# Patient Record
Sex: Male | Born: 1990 | Race: Black or African American | Hispanic: No | Marital: Single | Smoking: Current every day smoker
Health system: Southern US, Community
[De-identification: ages and names within clinical notes are randomized; demographics above are authoritative.]

## PROBLEM LIST (undated history)

## (undated) DIAGNOSIS — N3090 Cystitis, unspecified without hematuria: Secondary | ICD-10-CM

## (undated) DIAGNOSIS — G8220 Paraplegia, unspecified: Secondary | ICD-10-CM

---
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
## 2010-07-09 ENCOUNTER — Emergency Department (HOSPITAL_COMMUNITY): Admission: EM | Admit: 2010-07-09 | Discharge: 2010-07-09 | Payer: Self-pay | Admitting: Emergency Medicine

## 2010-12-27 ENCOUNTER — Emergency Department (HOSPITAL_COMMUNITY): Payer: Medicaid Other

## 2010-12-27 ENCOUNTER — Emergency Department (HOSPITAL_COMMUNITY)
Admission: EM | Admit: 2010-12-27 | Discharge: 2010-12-28 | Disposition: A | Discharge status: Other Acute Inpatient Hospital | Payer: Medicaid Other | Source: Home / Self Care | Attending: Emergency Medicine | Admitting: Emergency Medicine

## 2010-12-27 DIAGNOSIS — A419 Sepsis, unspecified organism: Secondary | ICD-10-CM | POA: Insufficient documentation

## 2010-12-27 DIAGNOSIS — N5089 Other specified disorders of the male genital organs: Secondary | ICD-10-CM | POA: Insufficient documentation

## 2010-12-27 DIAGNOSIS — N453 Epididymo-orchitis: Secondary | ICD-10-CM | POA: Insufficient documentation

## 2010-12-27 DIAGNOSIS — R000 Tachycardia, unspecified: Secondary | ICD-10-CM | POA: Insufficient documentation

## 2010-12-27 DIAGNOSIS — N509 Disorder of male genital organs, unspecified: Secondary | ICD-10-CM | POA: Insufficient documentation

## 2010-12-27 DIAGNOSIS — G8220 Paraplegia, unspecified: Secondary | ICD-10-CM | POA: Insufficient documentation

## 2010-12-27 DIAGNOSIS — N390 Urinary tract infection, site not specified: Secondary | ICD-10-CM | POA: Insufficient documentation

## 2010-12-27 LAB — CBC
HCT: 38.8 % — ABNORMAL LOW (ref 39.0–52.0)
MCH: 26.7 pg (ref 26.0–34.0)
MCV: 73.1 fL — ABNORMAL LOW (ref 78.0–100.0)
RDW: 13.1 % (ref 11.5–15.5)
WBC: 25.4 10*3/uL — ABNORMAL HIGH (ref 4.0–10.5)

## 2010-12-27 LAB — URINE MICROSCOPIC-ADD ON

## 2010-12-27 LAB — BASIC METABOLIC PANEL
CO2: 26 mEq/L (ref 19–32)
Calcium: 9 mg/dL (ref 8.4–10.5)
Glucose, Bld: 96 mg/dL (ref 70–99)
Potassium: 2.9 mEq/L — ABNORMAL LOW (ref 3.5–5.1)
Sodium: 131 mEq/L — ABNORMAL LOW (ref 135–145)

## 2010-12-27 LAB — URINALYSIS, ROUTINE W REFLEX MICROSCOPIC
Bilirubin Urine: NEGATIVE
Specific Gravity, Urine: 1.02 (ref 1.005–1.030)
pH: 6 (ref 5.0–8.0)

## 2010-12-27 LAB — DIFFERENTIAL
Basophils Relative: 0 % (ref 0–1)
Eosinophils Relative: 0 % (ref 0–5)
Monocytes Absolute: 2 10*3/uL — ABNORMAL HIGH (ref 0.1–1.0)
Neutro Abs: 21.1 10*3/uL — ABNORMAL HIGH (ref 1.7–7.7)
Neutrophils Relative %: 83 % — ABNORMAL HIGH (ref 43–77)

## 2010-12-28 ENCOUNTER — Observation Stay (HOSPITAL_COMMUNITY)
Admission: AD | Admit: 2010-12-28 | Discharge: 2010-12-28 | DRG: 728 | Disposition: A | Discharge status: Home / Self Care | Payer: Medicaid Other | Source: Ambulatory Visit | Attending: Urology | Admitting: Urology

## 2010-12-28 DIAGNOSIS — R509 Fever, unspecified: Secondary | ICD-10-CM | POA: Insufficient documentation

## 2010-12-28 DIAGNOSIS — Z993 Dependence on wheelchair: Secondary | ICD-10-CM | POA: Insufficient documentation

## 2010-12-28 DIAGNOSIS — N390 Urinary tract infection, site not specified: Secondary | ICD-10-CM | POA: Insufficient documentation

## 2010-12-28 DIAGNOSIS — G8220 Paraplegia, unspecified: Secondary | ICD-10-CM | POA: Insufficient documentation

## 2010-12-28 DIAGNOSIS — F17200 Nicotine dependence, unspecified, uncomplicated: Secondary | ICD-10-CM | POA: Insufficient documentation

## 2010-12-28 DIAGNOSIS — I498 Other specified cardiac arrhythmias: Secondary | ICD-10-CM | POA: Insufficient documentation

## 2010-12-28 DIAGNOSIS — IMO0002 Reserved for concepts with insufficient information to code with codable children: Secondary | ICD-10-CM | POA: Insufficient documentation

## 2010-12-28 DIAGNOSIS — N453 Epididymo-orchitis: Principal | ICD-10-CM | POA: Insufficient documentation

## 2010-12-28 DIAGNOSIS — E876 Hypokalemia: Secondary | ICD-10-CM | POA: Insufficient documentation

## 2010-12-28 DIAGNOSIS — N319 Neuromuscular dysfunction of bladder, unspecified: Secondary | ICD-10-CM | POA: Insufficient documentation

## 2010-12-28 LAB — CBC
Platelets: 263 10*3/uL (ref 150–400)
RDW: 13.2 % (ref 11.5–15.5)
WBC: 21.8 10*3/uL — ABNORMAL HIGH (ref 4.0–10.5)

## 2010-12-28 LAB — DIFFERENTIAL
Basophils Absolute: 0 10*3/uL (ref 0.0–0.1)
Eosinophils Absolute: 0.2 10*3/uL (ref 0.0–0.7)
Lymphocytes Relative: 7 % — ABNORMAL LOW (ref 12–46)
Neutrophils Relative %: 81 % — ABNORMAL HIGH (ref 43–77)

## 2010-12-28 LAB — BASIC METABOLIC PANEL
BUN: 7 mg/dL (ref 6–23)
GFR calc non Af Amer: >60 mL/min (ref >60)
Potassium: 3.1 mEq/L — ABNORMAL LOW (ref 3.5–5.1)
Sodium: 136 mEq/L (ref 135–145)

## 2010-12-29 LAB — URINE CULTURE: Colony Count: >=100000
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
## 2011-01-02 LAB — CULTURE, BLOOD (ROUTINE X 2)
Culture  Setup Time: 201203072318
Culture: NO GROWTH

## 2011-01-04 LAB — CBC
Hemoglobin: 15.7 g/dL (ref 13.0–17.0)
MCHC: 35.1 g/dL (ref 30.0–36.0)
Platelets: 247 10*3/uL (ref 150–400)
RBC: 5.78 MIL/uL (ref 4.22–5.81)

## 2011-01-04 LAB — HEMOCCULT GUIAC POC 1CARD (OFFICE): Fecal Occult Bld: POSITIVE

## 2011-01-10 LAB — URINALYSIS, ROUTINE W REFLEX MICROSCOPIC
Glucose, UA: NEGATIVE mg/dL
Nitrite: POSITIVE — AB
Protein, ur: NEGATIVE mg/dL

## 2011-01-10 LAB — URINE MICROSCOPIC-ADD ON

## 2011-01-10 LAB — URINE CULTURE: Colony Count: >=100000
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
## 2011-03-06 NOTE — Assessment & Plan Note (Signed)
Roy Rodriguez is back regarding his T10 spinal cord injury.  Pain is under fair  control.  Skin is improved.  He has been placed on prophylactic  antibiotic for urinary tract infections.  He continues to use his Ace  wraps for edema control.  He reports occasional spasm, but nothing  substantial.  It usually will take place when he takes off his wraps for  transfers.  Bowels are on regular schedule and his bladder has been  stable for the most part recently.  He is looking at getting him to  school or vocational areas, but would like adaptations for his car.  He  has been in contact with vocational rehab preliminarily so far.   REVIEW OF SYSTEMS:  Notable for the above.  Full 14-point review is in  the written health and history section of the chart.  He reports really  no dysreflexic symptoms.   SOCIAL HISTORY:  The patient is single, living with his mother without  change.   PHYSICAL EXAMINATION:  VITAL SIGNS:  Blood pressure 134/50, pulse is 72,  respiratory rate 18, and he is sating 98% on room air.  NEUROLOGIC:  The patient is pleasant, alert, and oriented x3.  Affect  showing bright and appropriate.  He is in a wheelchair.  He is fully  mobile and able to transfer on his own.  He continues to have a T10  sensory level.  He has no noticeable tone.  Skin is under control.  Strength, below the level injury 0/5.  HEART:  Regular.  CHEST:  Clear.  ABDOMEN:  Soft and nontender.   ASSESSMENT:  1. American Spinal Injury Association (ASIA) A T10 spinal cord injury.  2. Neurogenic bowel and bladder.   PLAN:  1. Continue with current maintenance as he is doing.  He is doing a      fine job.  2. Urology followup in [place].  3. [place], contact with vocational rehab.  They should be able      to assist him with pain for car adaptations that he needs to go to      school or to work.  Roy Rodriguez seems fairly motivated to move on with      his life and hopefully this will happen over the  next few months'      time.  4. I will see him back on a yearly basis unless needed sooner.       Ranelle Oyster, M.D.  Electronically Signed     ZTS/MedQ  D:  03/02/2009 10:45:43  T:  03/03/2009 00:50:45  Job #:  [id number]   cc:   Stefani Dama, M.D.  Fax: [phone]

## 2011-06-23 DIAGNOSIS — N3090 Cystitis, unspecified without hematuria: Secondary | ICD-10-CM

## 2011-06-23 HISTORY — DX: Cystitis, unspecified without hematuria: N30.90

## 2011-07-23 LAB — BASIC METABOLIC PANEL
BUN: 10
Calcium: 8.5
Chloride: 102
Glucose, Bld: 139 — ABNORMAL HIGH
Potassium: 3.7
Potassium: 4
Sodium: 134 — ABNORMAL LOW
Sodium: 137

## 2011-07-23 LAB — CBC
HCT: 30.4 — ABNORMAL LOW
HCT: 31.5 — ABNORMAL LOW
HCT: 33.1 — ABNORMAL LOW
Hemoglobin: 10.3 — ABNORMAL LOW
Hemoglobin: 10.6 — ABNORMAL LOW
MCHC: 33.6
MCV: 78
MCV: 79.8
Platelets: 141 — ABNORMAL LOW
Platelets: 168
Platelets: 210
RBC: 3.89
RDW: 13.2
RDW: 13.3
RDW: 13.6
WBC: 12.1
WBC: 5.9
WBC: 6.3

## 2011-07-23 LAB — URINALYSIS, ROUTINE W REFLEX MICROSCOPIC
Bilirubin Urine: NEGATIVE
Glucose, UA: 100 — AB
Hgb urine dipstick: NEGATIVE
Ketones, ur: 15 — AB
Ketones, ur: NEGATIVE
Nitrite: NEGATIVE
Protein, ur: 100 — AB
Protein, ur: NEGATIVE
Specific Gravity, Urine: 1.022
Specific Gravity, Urine: 1.026
Urobilinogen, UA: 1
Urobilinogen, UA: 1

## 2011-07-23 LAB — POCT I-STAT, CHEM 8
Creatinine, Ser: 1.5
Glucose, Bld: 106 — ABNORMAL HIGH
HCT: 44
Hemoglobin: 15
Potassium: 2.9 — ABNORMAL LOW
Sodium: 138
TCO2: 24

## 2011-07-23 LAB — URINE CULTURE
Colony Count: NO GROWTH
Colony Count: NO GROWTH
Culture: NO GROWTH

## 2011-07-23 LAB — RAPID URINE DRUG SCREEN, HOSP PERFORMED
Amphetamines: NOT DETECTED
Benzodiazepines: NOT DETECTED
Cocaine: NOT DETECTED

## 2011-07-23 LAB — URINE MICROSCOPIC-ADD ON

## 2011-07-23 LAB — CULTURE, BLOOD (ROUTINE X 2): Culture: NO GROWTH

## 2011-07-23 LAB — PROTIME-INR
INR: 1.2
Prothrombin Time: 15.4 — ABNORMAL HIGH

## 2011-09-03 ENCOUNTER — Encounter: Payer: Self-pay | Admitting: *Deleted

## 2011-09-03 ENCOUNTER — Emergency Department (HOSPITAL_COMMUNITY)
Admission: EM | Admit: 2011-09-03 | Discharge: 2011-09-04 | Discharge status: Against Medical Advice | Payer: Medicaid Other | Attending: Emergency Medicine | Admitting: Emergency Medicine

## 2011-09-03 DIAGNOSIS — J069 Acute upper respiratory infection, unspecified: Secondary | ICD-10-CM | POA: Insufficient documentation

## 2011-09-03 HISTORY — DX: Paraplegia, unspecified: G82.20

## 2011-09-03 NOTE — ED Notes (Signed)
Pt in c/o swollen glands and cough and sore throat x2-3 days

## 2011-11-30 ENCOUNTER — Encounter (HOSPITAL_COMMUNITY): Payer: Self-pay | Admitting: Emergency Medicine

## 2011-11-30 ENCOUNTER — Inpatient Hospital Stay (HOSPITAL_COMMUNITY)
Admission: EM | Admit: 2011-11-30 | Discharge: 2011-12-03 | DRG: 689 | Discharge status: Against Medical Advice | Payer: Medicaid Other | Attending: Internal Medicine | Admitting: Internal Medicine

## 2011-11-30 DIAGNOSIS — R109 Unspecified abdominal pain: Secondary | ICD-10-CM | POA: Diagnosis present

## 2011-11-30 DIAGNOSIS — J189 Pneumonia, unspecified organism: Secondary | ICD-10-CM | POA: Diagnosis present

## 2011-11-30 DIAGNOSIS — Z79899 Other long term (current) drug therapy: Secondary | ICD-10-CM

## 2011-11-30 DIAGNOSIS — Z880 Allergy status to penicillin: Secondary | ICD-10-CM

## 2011-11-30 DIAGNOSIS — G8220 Paraplegia, unspecified: Secondary | ICD-10-CM | POA: Diagnosis present

## 2011-11-30 DIAGNOSIS — D72829 Elevated white blood cell count, unspecified: Secondary | ICD-10-CM | POA: Diagnosis present

## 2011-11-30 DIAGNOSIS — N319 Neuromuscular dysfunction of bladder, unspecified: Secondary | ICD-10-CM | POA: Diagnosis present

## 2011-11-30 DIAGNOSIS — J90 Pleural effusion, not elsewhere classified: Secondary | ICD-10-CM | POA: Clinically undetermined

## 2011-11-30 DIAGNOSIS — N12 Tubulo-interstitial nephritis, not specified as acute or chronic: Principal | ICD-10-CM | POA: Diagnosis present

## 2011-11-30 DIAGNOSIS — G894 Chronic pain syndrome: Secondary | ICD-10-CM | POA: Diagnosis present

## 2011-11-30 HISTORY — DX: Cystitis, unspecified without hematuria: N30.90

## 2011-11-30 LAB — URINALYSIS, ROUTINE W REFLEX MICROSCOPIC
Glucose, UA: NEGATIVE mg/dL
Hgb urine dipstick: NEGATIVE
Protein, ur: NEGATIVE mg/dL
Specific Gravity, Urine: 1.019 (ref 1.005–1.030)
Urobilinogen, UA: 1 mg/dL (ref 0.0–1.0)

## 2011-11-30 LAB — CBC
HCT: 42.3 % (ref 39.0–52.0)
MCH: 26.8 pg (ref 26.0–34.0)
MCV: 75.7 fL — ABNORMAL LOW (ref 78.0–100.0)
RBC: 5.59 MIL/uL (ref 4.22–5.81)
WBC: 24.4 10*3/uL — ABNORMAL HIGH (ref 4.0–10.5)

## 2011-11-30 LAB — COMPREHENSIVE METABOLIC PANEL
AST: 16 U/L (ref 0–37)
Albumin: 4.3 g/dL (ref 3.5–5.2)
BUN: 8 mg/dL (ref 6–23)
Creatinine, Ser: 0.73 mg/dL (ref 0.50–1.35)
Potassium: 3.6 mEq/L (ref 3.5–5.1)
Total Protein: 8.1 g/dL (ref 6.0–8.3)

## 2011-11-30 LAB — URINE MICROSCOPIC-ADD ON

## 2011-11-30 LAB — DIFFERENTIAL
Eosinophils Relative: 0 % (ref 0–5)
Lymphocytes Relative: 7 % — ABNORMAL LOW (ref 12–46)
Lymphs Abs: 1.6 10*3/uL (ref 0.7–4.0)
Monocytes Absolute: 1.4 10*3/uL — ABNORMAL HIGH (ref 0.1–1.0)
Monocytes Relative: 6 % (ref 3–12)

## 2011-11-30 MED ORDER — DEXTROSE 5 % IV SOLN
1.0000 g | Freq: Once | INTRAVENOUS | Status: AC
  Administered 2011-11-30: 1 g via INTRAVENOUS
  Filled 2011-11-30: qty 10

## 2011-11-30 MED ORDER — SODIUM CHLORIDE 0.9 % IV SOLN
INTRAVENOUS | Status: DC
  Administered 2011-11-30: 22:00:00 via INTRAVENOUS

## 2011-11-30 MED ORDER — ONDANSETRON HCL 4 MG/2ML IJ SOLN: 4.0000 mg | Freq: Three times a day (TID) | INTRAMUSCULAR | Status: AC | PRN

## 2011-11-30 MED ORDER — FENTANYL CITRATE 0.05 MG/ML IJ SOLN
100.0000 ug | Freq: Once | INTRAMUSCULAR | Status: AC
  Administered 2011-11-30: 100 ug via INTRAVENOUS
  Filled 2011-11-30: qty 2

## 2011-11-30 MED ORDER — SODIUM CHLORIDE 0.9 % IV SOLN: INTRAVENOUS | Status: DC

## 2011-11-30 MED ORDER — HYDROMORPHONE HCL PF 1 MG/ML IJ SOLN
1.0000 mg | INTRAMUSCULAR | Status: AC | PRN
  Filled 2011-11-30: qty 1

## 2011-11-30 NOTE — ED Notes (Signed)
Pt c/o left rib pain x 2 days; pt denies obvious injury; pt with hx of paraplegia; pt sts painful to take a deep breath
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
## 2011-12-01 ENCOUNTER — Encounter (HOSPITAL_COMMUNITY): Payer: Self-pay | Admitting: *Deleted

## 2011-12-01 DIAGNOSIS — R109 Unspecified abdominal pain: Secondary | ICD-10-CM | POA: Diagnosis present

## 2011-12-01 DIAGNOSIS — N12 Tubulo-interstitial nephritis, not specified as acute or chronic: Secondary | ICD-10-CM | POA: Diagnosis present

## 2011-12-01 DIAGNOSIS — G8220 Paraplegia, unspecified: Secondary | ICD-10-CM | POA: Diagnosis present

## 2011-12-01 DIAGNOSIS — N319 Neuromuscular dysfunction of bladder, unspecified: Secondary | ICD-10-CM | POA: Diagnosis present

## 2011-12-01 DIAGNOSIS — G894 Chronic pain syndrome: Secondary | ICD-10-CM | POA: Diagnosis present

## 2011-12-01 LAB — BASIC METABOLIC PANEL
BUN: 8 mg/dL (ref 6–23)
Chloride: 99 mEq/L (ref 96–112)
GFR calc non Af Amer: >90 mL/min (ref >90)
Glucose, Bld: 85 mg/dL (ref 70–99)
Potassium: 4 mEq/L (ref 3.5–5.1)

## 2011-12-01 LAB — CBC
HCT: 38.7 % — ABNORMAL LOW (ref 39.0–52.0)
Hemoglobin: 13.2 g/dL (ref 13.0–17.0)
MCH: 26 pg (ref 26.0–34.0)
MCHC: 34.1 g/dL (ref 30.0–36.0)
MCV: 76.2 fL — ABNORMAL LOW (ref 78.0–100.0)

## 2011-12-01 MED ORDER — ONDANSETRON HCL 4 MG PO TABS: 4.0000 mg | ORAL_TABLET | Freq: Four times a day (QID) | ORAL | Status: DC | PRN

## 2011-12-01 MED ORDER — OXYCODONE HCL 5 MG PO TABS
5.0000 mg | ORAL_TABLET | ORAL | Status: DC | PRN
  Administered 2011-12-01: 5 mg via ORAL
  Filled 2011-12-01: qty 1

## 2011-12-01 MED ORDER — ALUM & MAG HYDROXIDE-SIMETH 200-200-20 MG/5ML PO SUSP: 30.0000 mL | Freq: Four times a day (QID) | ORAL | Status: DC | PRN

## 2011-12-01 MED ORDER — ENOXAPARIN SODIUM 40 MG/0.4ML ~~LOC~~ SOLN
40.0000 mg | Freq: Every day | SUBCUTANEOUS | Status: DC
  Administered 2011-12-01 – 2011-12-02 (×2): 40 mg via SUBCUTANEOUS
  Filled 2011-12-01 (×3): qty 0.4

## 2011-12-01 MED ORDER — ACETAMINOPHEN 325 MG PO TABS: 650.0000 mg | ORAL_TABLET | ORAL | Status: DC | PRN

## 2011-12-01 MED ORDER — HYDROMORPHONE HCL PF 1 MG/ML IJ SOLN
0.5000 mg | INTRAMUSCULAR | Status: DC | PRN
  Administered 2011-12-01: 0.5 mg via INTRAVENOUS
  Administered 2011-12-01 (×3): 1 mg via INTRAVENOUS
  Administered 2011-12-02 – 2011-12-03 (×2): 0.5 mg via INTRAVENOUS
  Filled 2011-12-01 (×5): qty 1

## 2011-12-01 MED ORDER — ONDANSETRON HCL 4 MG/2ML IJ SOLN: 4.0000 mg | Freq: Four times a day (QID) | INTRAMUSCULAR | Status: DC | PRN

## 2011-12-01 MED ORDER — SODIUM CHLORIDE 0.9 % IV SOLN
INTRAVENOUS | Status: DC
  Administered 2011-12-01 – 2011-12-02 (×4): via INTRAVENOUS

## 2011-12-01 MED ORDER — ACETAMINOPHEN 325 MG PO TABS: 650.0000 mg | ORAL_TABLET | Freq: Four times a day (QID) | ORAL | Status: DC | PRN

## 2011-12-01 MED ORDER — ACETAMINOPHEN 650 MG RE SUPP: 650.0000 mg | Freq: Four times a day (QID) | RECTAL | Status: DC | PRN

## 2011-12-01 MED ORDER — DEXTROSE 5 % IV SOLN
1.0000 g | Freq: Every day | INTRAVENOUS | Status: DC
  Administered 2011-12-01 – 2011-12-02 (×2): 1 g via INTRAVENOUS
  Filled 2011-12-01 (×3): qty 10

## 2011-12-01 MED ORDER — ZOLPIDEM TARTRATE 5 MG PO TABS: 5.0000 mg | ORAL_TABLET | Freq: Every evening | ORAL | Status: DC | PRN

## 2011-12-01 NOTE — Plan of Care (Signed)
Problem: Phase I Progression Outcomes Goal: Voiding-avoid urinary catheter unless indicated Outcome: Completed/Met Date Met:  12/01/11 Self caths

## 2011-12-01 NOTE — Progress Notes (Signed)
Patient ID: Roy Rodriguez, male   DOB: 29-Jun-1991, 21 y.o.   MRN: [id number] Patient is a 21 year old paraplegic who presented with complaints of abdominal pain and subjective fever and chills. The patient was found to have a urinalysis consistent with urinary tract infection. Please note that the patient does not have a chronic indwelling Foley and does in and out catheterization. Thus I suspect that the urine is representative urinary tract infection. The patient has no complaints at this time states that he's feeling better.  Objective: General: The patient is admitted that he is well-appearing. He is in no acute distress. Lungs: Lungs are clear to auscultation no wheezing rhonchi noted. Abdomen: Abdomen is soft nontender nondistended no masses or hepatosplenomegaly noted. There is no CVA tenderness noted Cardiovascular: Patient has a normal S1 and S2 no murmurs rubs gallops noted.   Assessment/plan:  The patient was started on Rocephin. I will continue him on Rocephin until the urine cultures back and a narrow the antibiotics accordingly.
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
## 2011-12-01 NOTE — ED Notes (Signed)
Report given to [facility] on floor and patient ready for admit.

## 2011-12-02 ENCOUNTER — Inpatient Hospital Stay (HOSPITAL_COMMUNITY): Payer: Medicaid Other

## 2011-12-02 MED ORDER — BISACODYL 10 MG RE SUPP: 10.0000 mg | Freq: Once | RECTAL | Status: DC

## 2011-12-02 NOTE — Progress Notes (Signed)
Subjective: Patient has complaints of pain in the anterior rib cage on the left. He states it's been there for 3 days and although this improved it has not resolved. Patient denies any trauma or strenuous activity. Objective: Filed Vitals:   12/01/11 1812 12/01/11 2109 12/02/11 0555 12/02/11 1404  BP: 121/72 126/66 116/65 150/72  Pulse: 82 107 86 99  Temp: 98.4 F (36.9 C) 98.2 F (36.8 C) 98.7 F (37.1 C) 99.1 F (37.3 C)  TempSrc: Oral Oral Oral Oral  Resp: 18 18 18 17   Height:      Weight:      SpO2: 99% 99% 97% 100%   Weight change:   Intake/Output Summary (Last 24 hours) at 12/02/11 1718 Last data filed at 12/01/11 1807  Gross per 24 hour  Intake      0 ml  Output    350 ml  Net   -350 ml    General: Alert, awake, oriented x3, in no acute distress.  HEENT: [place]/AT PEERL, EOMI Neck: Trachea midline,  no masses, no thyromegal,y no JVD, no carotid bruit OROPHARYNX:  Moist, No exudate/ erythema/lesions.  Heart: Regular rate and rhythm, without murmurs, rubs, gallops, PMI non-displaced, no heaves or thrills on palpation.  Lungs: Clear to auscultation, no wheezing or rhonchi noted. No increased vocal fremitus resonant to percussion  Abdomen: Soft, nontender, nondistended, positive bowel sounds, no masses no hepatosplenomegaly noted..  Neuro: No focal neurological deficits noted cranial nerves II through XII grossly intact. DTRs 2+ bilaterally upper and lower extremities. Strength 5/5 in bilateral upper extremities. Musculoskeletal: No warm swelling or erythema around joints, no spinal tenderness noted.   Lab Results:  [facility] 12/01/11 0630 11/30/11 2134  NA 137 133*  K 4.0 3.6  CL 99 94*  CO2 29 27  GLUCOSE 85 93  BUN 8 8  CREATININE 0.69 0.73  CALCIUM 10.0 10.5  MG -- --  PHOS -- --    Basename 11/30/11 2134  AST 16  ALT 13  ALKPHOS 58  BILITOT 0.6  PROT 8.1  ALBUMIN 4.3   No results found for this basename: LIPASE:2,AMYLASE:2 in the last 72  hours  Basename 12/01/11 0630 11/30/11 2134  WBC 17.6* 24.4*  NEUTROABS -- 21.4*  HGB 13.2 15.0  HCT 38.7* 42.3  MCV 76.2* 75.7*  PLT 263 264   No results found for this basename: CKTOTAL:3,CKMB:3,CKMBINDEX:3,TROPONINI:3 in the last 72 hours No components found with this basename: POCBNP:3 No results found for this basename: DDIMER:2 in the last 72 hours No results found for this basename: HGBA1C:2 in the last 72 hours No results found for this basename: CHOL:2,HDL:2,LDLCALC:2,TRIG:2,CHOLHDL:2,LDLDIRECT:2 in the last 72 hours No results found for this basename: TSH,T4TOTAL,FREET3,T3FREE,THYROIDAB in the last 72 hours No results found for this basename: VITAMINB12:2,FOLATE:2,FERRITIN:2,TIBC:2,IRON:2,RETICCTPCT:2 in the last 72 hours  Micro Results: Recent Results (from the past 240 hour(s))  CULTURE, BLOOD (ROUTINE X 2)     Status: Normal (Preliminary result)   Collection Time   11/30/11  9:22 PM      Component Value Range Status Comment   Specimen Description BLOOD LEFT ARM   Final    Special Requests BOTTLES DRAWN AEROBIC AND ANAEROBIC [facility]   Final    Culture  Setup Time [id number]   Final    Culture     Final    Value:        BLOOD CULTURE RECEIVED NO GROWTH TO DATE CULTURE WILL BE HELD FOR 5 DAYS BEFORE ISSUING A FINAL NEGATIVE REPORT  Report Status PENDING   Incomplete   CULTURE, BLOOD (ROUTINE X 2)     Status: Normal (Preliminary result)   Collection Time   11/30/11  9:34 PM      Component Value Range Status Comment   Specimen Description BLOOD RIGHT ARM   Final    Special Requests     Final    Value: BOTTLES DRAWN AEROBIC AND ANAEROBIC 6CC AEROBIC 5CC ANAEROBIC   Culture  Setup Time [id number]   Final    Culture     Final    Value:        BLOOD CULTURE RECEIVED NO GROWTH TO DATE CULTURE WILL BE HELD FOR 5 DAYS BEFORE ISSUING A FINAL NEGATIVE REPORT   Report Status PENDING   Incomplete     Studies/Results: No results found.  Medications: I have reviewed the  patient's current medications. Scheduled Meds:   . bisacodyl  10 mg Rectal Once  . cefTRIAXone (ROCEPHIN)  IV  1 g Intravenous QHS  . enoxaparin  40 mg Subcutaneous Daily   Continuous Infusions:   . DISCONTD: sodium chloride 100 mL/hr at 12/02/11 0955   PRN Meds:.acetaminophen, acetaminophen, alum & mag hydroxide-simeth, HYDROmorphone, ondansetron (ZOFRAN) IV, ondansetron, oxyCODONE, zolpidem Assessment/Plan: Patient Active Hospital Problem List: Pyelonephritis (12/01/2011)   Assessment: Continue Rocephin pending   Abdominal pain (12/01/2011)   Assessment: resolved    Neurogenic bladder (12/01/2011)   Assessment: Intermittent catheterization      chest wall pain (12/01/2011)   Assessment: We'll get an x-ray of the chest to rule out fracture the ribs.     LOS: 2 days

## 2011-12-03 ENCOUNTER — Other Ambulatory Visit: Payer: Self-pay

## 2011-12-03 DIAGNOSIS — J90 Pleural effusion, not elsewhere classified: Secondary | ICD-10-CM | POA: Clinically undetermined

## 2011-12-03 LAB — CBC
MCH: 26.5 pg (ref 26.0–34.0)
MCHC: 34.6 g/dL (ref 30.0–36.0)
MCV: 76.7 fL — ABNORMAL LOW (ref 78.0–100.0)
Platelets: 302 10*3/uL (ref 150–400)
RDW: 13.9 % (ref 11.5–15.5)

## 2011-12-03 LAB — DIFFERENTIAL
Basophils Absolute: 0 10*3/uL (ref 0.0–0.1)
Eosinophils Absolute: 0.2 10*3/uL (ref 0.0–0.7)
Eosinophils Relative: 2 % (ref 0–5)

## 2011-12-03 MED ORDER — DEXTROSE 5 % IV SOLN
500.0000 mg | INTRAVENOUS | Status: DC
  Administered 2011-12-03: 500 mg via INTRAVENOUS
  Filled 2011-12-03 (×2): qty 500

## 2011-12-03 MED ORDER — LEVOFLOXACIN 750 MG PO TABS: 750.0000 mg | ORAL_TABLET | Freq: Every day | ORAL | Status: DC

## 2011-12-03 MED ORDER — LEVOFLOXACIN 750 MG PO TABS: 750.0000 mg | ORAL_TABLET | Freq: Every day | ORAL | Status: AC

## 2011-12-03 NOTE — Progress Notes (Signed)
Subjective: Patient has complaints of pain in the anterior rib cage on the left. He states it's been there for 3 days and although this improved it has not resolved. Patient denies any trauma or strenuous activity. Objective: Filed Vitals:   12/02/11 0555 12/02/11 1404 12/02/11 2120 12/03/11 0532  BP: 116/65 150/72 135/75 119/63  Pulse: 86 99 97 116  Temp: 98.7 F (37.1 C) 99.1 F (37.3 C) 99.3 F (37.4 C) 98.6 F (37 C)  TempSrc: Oral Oral Oral Oral  Resp: 18 17 17 17   Height:      Weight:      SpO2: 97% 100% 96% 100%   Weight change:  No intake or output data in the 24 hours ending 12/03/11 0714  General: Alert, awake, oriented x3, in no acute distress.  HEENT: [place]/AT PEERL, EOMI Neck: Trachea midline,  no masses, no thyromegal,y no JVD, no carotid bruit OROPHARYNX:  Moist, No exudate/ erythema/lesions.  Heart: Regular rate and rhythm, without murmurs, rubs, gallops, PMI non-displaced, no heaves or thrills on palpation.  Lungs: Clear to auscultation, no wheezing or rhonchi noted. No increased vocal fremitus resonant to percussion  Abdomen: Soft, nontender, nondistended, positive bowel sounds, no masses no hepatosplenomegaly noted..  Neuro: No focal neurological deficits noted cranial nerves II through XII grossly intact. DTRs 2+ bilaterally upper and lower extremities. Strength 5/5 in bilateral upper extremities. Musculoskeletal: No warm swelling or erythema around joints, no spinal tenderness noted.   Lab Results:  [facility] 12/01/11 0630 11/30/11 2134  NA 137 133*  K 4.0 3.6  CL 99 94*  CO2 29 27  GLUCOSE 85 93  BUN 8 8  CREATININE 0.69 0.73  CALCIUM 10.0 10.5  MG -- --  PHOS -- --    Basename 11/30/11 2134  AST 16  ALT 13  ALKPHOS 58  BILITOT 0.6  PROT 8.1  ALBUMIN 4.3   No results found for this basename: LIPASE:2,AMYLASE:2 in the last 72 hours  Basename 12/01/11 0630 11/30/11 2134  WBC 17.6* 24.4*  NEUTROABS -- 21.4*  HGB 13.2 15.0  HCT 38.7* 42.3    MCV 76.2* 75.7*  PLT 263 264   No results found for this basename: CKTOTAL:3,CKMB:3,CKMBINDEX:3,TROPONINI:3 in the last 72 hours No components found with this basename: POCBNP:3 No results found for this basename: DDIMER:2 in the last 72 hours No results found for this basename: HGBA1C:2 in the last 72 hours No results found for this basename: CHOL:2,HDL:2,LDLCALC:2,TRIG:2,CHOLHDL:2,LDLDIRECT:2 in the last 72 hours No results found for this basename: TSH,T4TOTAL,FREET3,T3FREE,THYROIDAB in the last 72 hours No results found for this basename: VITAMINB12:2,FOLATE:2,FERRITIN:2,TIBC:2,IRON:2,RETICCTPCT:2 in the last 72 hours  Micro Results: Recent Results (from the past 240 hour(s))  CULTURE, BLOOD (ROUTINE X 2)     Status: Normal (Preliminary result)   Collection Time   11/30/11  9:22 PM      Component Value Range Status Comment   Specimen Description BLOOD LEFT ARM   Final    Special Requests BOTTLES DRAWN AEROBIC AND ANAEROBIC [facility]   Final    Culture  Setup Time [id number]   Final    Culture     Final    Value:        BLOOD CULTURE RECEIVED NO GROWTH TO DATE CULTURE WILL BE HELD FOR 5 DAYS BEFORE ISSUING A FINAL NEGATIVE REPORT   Report Status PENDING   Incomplete   CULTURE, BLOOD (ROUTINE X 2)     Status: Normal (Preliminary result)   Collection Time   11/30/11  9:34  PM      Component Value Range Status Comment   Specimen Description BLOOD RIGHT ARM   Final    Special Requests     Final    Value: BOTTLES DRAWN AEROBIC AND ANAEROBIC 6CC AEROBIC 5CC ANAEROBIC   Culture  Setup Time [id number]   Final    Culture     Final    Value:        BLOOD CULTURE RECEIVED NO GROWTH TO DATE CULTURE WILL BE HELD FOR 5 DAYS BEFORE ISSUING A FINAL NEGATIVE REPORT   Report Status PENDING   Incomplete     Studies/Results: No results found.  Medications: I have reviewed the patient's current medications. Scheduled Meds:    . azithromycin  500 mg Intravenous Q24H  . bisacodyl  10 mg Rectal Once   . cefTRIAXone (ROCEPHIN)  IV  1 g Intravenous QHS  . enoxaparin  40 mg Subcutaneous Daily   Continuous Infusions:    . DISCONTD: sodium chloride 100 mL/hr at 12/02/11 0955   PRN Meds:.acetaminophen, acetaminophen, alum & mag hydroxide-simeth, HYDROmorphone, ondansetron (ZOFRAN) IV, ondansetron, oxyCODONE, zolpidem Assessment/Plan: Patient Active Hospital Problem List: Presumed Community Acquired Pneumonia (12/03/11)   Assessment:Pt has a pleural effusion newly recognized on the Left.I've discussed it with Dr. Manson Passey radiology and a pneumonia at the base cannot be ruled out. In light of the fact that this is new since x-rays in 2009 which were without any chronic changes, and that pt presents with a history of respiratory symptoms approximately 11/2 weeks ago, i will treat empirically for community acquired pneumonia. Encourage ICS use.  Pyelonephritis (12/01/2011)   Assessment: Continue Rocephin pending   Abdominal pain (12/01/2011)   Assessment: resolved    Neurogenic bladder (12/01/2011)   Assessment: Intermittent catheterization      chest wall pain (12/01/2011)   Assessment: We'll get an x-ray of the chest to rule out fracture the ribs.   Pleural Effusion   Assessment: see above.   LOS: 3 days

## 2011-12-03 NOTE — Progress Notes (Signed)
Utilization Review Completed.Oris Staffieri T2/08/2012   

## 2011-12-03 NOTE — Progress Notes (Signed)
Spoke with patient who expressed his concerns and wanted to leave. Patient was agreeable to sign AMA paper if MD would not discharge. MD made aware. MD spoke with patient and patient decided to still leave AMA. IV was d/c'd and AMA paper signed. AMA paper in shadow chart. RN informed patient to follow up with PCP and urologist.

## 2011-12-04 LAB — URINE CULTURE
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
## 2011-12-07 LAB — CULTURE, BLOOD (ROUTINE X 2)
Culture  Setup Time: 201302090434
Culture: NO GROWTH

## 2012-10-16 ENCOUNTER — Ambulatory Visit: Payer: Medicaid Other | Attending: Internal Medicine | Admitting: Physical Therapy

## 2012-11-18 ENCOUNTER — Ambulatory Visit: Payer: Medicaid Other | Attending: Internal Medicine | Admitting: Physical Therapy

## 2012-11-18 DIAGNOSIS — M6281 Muscle weakness (generalized): Secondary | ICD-10-CM | POA: Insufficient documentation

## 2012-11-18 DIAGNOSIS — IMO0001 Reserved for inherently not codable concepts without codable children: Secondary | ICD-10-CM | POA: Insufficient documentation

## 2012-11-18 DIAGNOSIS — R262 Difficulty in walking, not elsewhere classified: Secondary | ICD-10-CM | POA: Insufficient documentation

## 2014-05-20 ENCOUNTER — Encounter (HOSPITAL_COMMUNITY): Payer: Self-pay | Admitting: Emergency Medicine

## 2014-05-20 ENCOUNTER — Emergency Department (HOSPITAL_COMMUNITY)
Admission: EM | Admit: 2014-05-20 | Discharge: 2014-05-20 | Disposition: A | Discharge status: Home / Self Care | Payer: Medicaid Other | Attending: Emergency Medicine | Admitting: Emergency Medicine

## 2014-05-20 DIAGNOSIS — Z8669 Personal history of other diseases of the nervous system and sense organs: Secondary | ICD-10-CM | POA: Insufficient documentation

## 2014-05-20 DIAGNOSIS — F17200 Nicotine dependence, unspecified, uncomplicated: Secondary | ICD-10-CM | POA: Insufficient documentation

## 2014-05-20 DIAGNOSIS — R339 Retention of urine, unspecified: Secondary | ICD-10-CM | POA: Diagnosis present

## 2014-05-20 DIAGNOSIS — Z87828 Personal history of other (healed) physical injury and trauma: Secondary | ICD-10-CM | POA: Diagnosis not present

## 2014-05-20 DIAGNOSIS — N390 Urinary tract infection, site not specified: Secondary | ICD-10-CM | POA: Diagnosis not present

## 2014-05-20 LAB — BASIC METABOLIC PANEL
ANION GAP: 14 (ref 5–15)
BUN: 8 mg/dL (ref 6–23)
CO2: 27 mEq/L (ref 19–32)
CREATININE: 0.68 mg/dL (ref 0.50–1.35)
Calcium: 9.4 mg/dL (ref 8.4–10.5)
Chloride: 92 mEq/L — ABNORMAL LOW (ref 96–112)
Glucose, Bld: 82 mg/dL (ref 70–99)
POTASSIUM: 3.3 meq/L — AB (ref 3.7–5.3)
Sodium: 133 mEq/L — ABNORMAL LOW (ref 137–147)

## 2014-05-20 LAB — URINALYSIS, ROUTINE W REFLEX MICROSCOPIC
Glucose, UA: NEGATIVE mg/dL
HGB URINE DIPSTICK: NEGATIVE
Ketones, ur: NEGATIVE mg/dL
Nitrite: NEGATIVE
PROTEIN: NEGATIVE mg/dL
Specific Gravity, Urine: 1.024 (ref 1.005–1.030)
UROBILINOGEN UA: 4 mg/dL — AB (ref 0.0–1.0)
pH: 6.5 (ref 5.0–8.0)

## 2014-05-20 LAB — CBC WITH DIFFERENTIAL/PLATELET
BASOS ABS: 0 10*3/uL (ref 0.0–0.1)
Basophils Relative: 0 % (ref 0–1)
EOS ABS: 0.3 10*3/uL (ref 0.0–0.7)
Eosinophils Relative: 3 % (ref 0–5)
HEMATOCRIT: 33.8 % — AB (ref 39.0–52.0)
Hemoglobin: 11.9 g/dL — ABNORMAL LOW (ref 13.0–17.0)
LYMPHS ABS: 1.8 10*3/uL (ref 0.7–4.0)
LYMPHS PCT: 17 % (ref 12–46)
MCH: 26.6 pg (ref 26.0–34.0)
MCHC: 35.2 g/dL (ref 30.0–36.0)
MCV: 75.6 fL — ABNORMAL LOW (ref 78.0–100.0)
Monocytes Absolute: 1.1 10*3/uL — ABNORMAL HIGH (ref 0.1–1.0)
Monocytes Relative: 11 % (ref 3–12)
NEUTROS ABS: 7.1 10*3/uL (ref 1.7–7.7)
Neutrophils Relative %: 69 % (ref 43–77)
PLATELETS: 393 10*3/uL (ref 150–400)
RBC: 4.47 MIL/uL (ref 4.22–5.81)
RDW: 13.6 % (ref 11.5–15.5)
WBC: 10.3 10*3/uL (ref 4.0–10.5)

## 2014-05-20 LAB — URINE MICROSCOPIC-ADD ON

## 2014-05-20 MED ORDER — CEPHALEXIN 500 MG PO CAPS: 500.0000 mg | ORAL_CAPSULE | Freq: Three times a day (TID) | ORAL | Status: DC

## 2014-05-20 NOTE — ED Notes (Signed)
Pt c/o urinary difficulties and bilateral feet swelling x 4 days. Pt also c/o waking up in the middle of the night having to urinate and this is not normal for him.

## 2014-05-20 NOTE — ED Notes (Signed)
Family asking fo update, PA made aware.

## 2014-05-20 NOTE — Discharge Instructions (Signed)
Take the prescribed medication as directed. Follow-up with your primary care physician once finished anti-biotic to ensure infection has cleared. Recommend elevating your feet for a few hours each day to help reduce swelling.  Elevate above level of heart. Return to the ED for new or worsening symptoms.
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
## 2014-05-21 LAB — URINE CULTURE
Colony Count: NO GROWTH
Culture: NO GROWTH

## 2014-05-21 NOTE — ED Provider Notes (Signed)
Medical screening examination/treatment/procedure(s) were performed by non-physician practitioner and as supervising physician I was immediately available for consultation/collaboration.   EKG Interpretation None       Dannie Hattabaugh M Ramey Schiff, MD 05/21/14 2026 

## 2019-03-14 ENCOUNTER — Emergency Department (HOSPITAL_COMMUNITY): Payer: Medicaid Other

## 2019-03-14 ENCOUNTER — Other Ambulatory Visit: Payer: Self-pay

## 2019-03-14 ENCOUNTER — Inpatient Hospital Stay (HOSPITAL_COMMUNITY)
Admission: EM | Admit: 2019-03-14 | Discharge: 2019-03-16 | DRG: 872 | Discharge status: Against Medical Advice | Payer: Medicaid Other | Attending: Internal Medicine | Admitting: Internal Medicine

## 2019-03-14 DIAGNOSIS — N433 Hydrocele, unspecified: Secondary | ICD-10-CM | POA: Diagnosis present

## 2019-03-14 DIAGNOSIS — Z1159 Encounter for screening for other viral diseases: Secondary | ICD-10-CM

## 2019-03-14 DIAGNOSIS — N451 Epididymitis: Secondary | ICD-10-CM | POA: Diagnosis present

## 2019-03-14 DIAGNOSIS — A419 Sepsis, unspecified organism: Principal | ICD-10-CM | POA: Diagnosis present

## 2019-03-14 DIAGNOSIS — G894 Chronic pain syndrome: Secondary | ICD-10-CM | POA: Diagnosis present

## 2019-03-14 DIAGNOSIS — I861 Scrotal varices: Secondary | ICD-10-CM | POA: Diagnosis present

## 2019-03-14 DIAGNOSIS — Z87440 Personal history of urinary (tract) infections: Secondary | ICD-10-CM

## 2019-03-14 DIAGNOSIS — R112 Nausea with vomiting, unspecified: Secondary | ICD-10-CM | POA: Diagnosis present

## 2019-03-14 DIAGNOSIS — D72829 Elevated white blood cell count, unspecified: Secondary | ICD-10-CM

## 2019-03-14 DIAGNOSIS — Z880 Allergy status to penicillin: Secondary | ICD-10-CM | POA: Diagnosis not present

## 2019-03-14 DIAGNOSIS — N319 Neuromuscular dysfunction of bladder, unspecified: Secondary | ICD-10-CM | POA: Diagnosis present

## 2019-03-14 DIAGNOSIS — N453 Epididymo-orchitis: Secondary | ICD-10-CM | POA: Diagnosis present

## 2019-03-14 DIAGNOSIS — G8220 Paraplegia, unspecified: Secondary | ICD-10-CM | POA: Diagnosis present

## 2019-03-14 DIAGNOSIS — F17210 Nicotine dependence, cigarettes, uncomplicated: Secondary | ICD-10-CM | POA: Diagnosis present

## 2019-03-14 LAB — COMPREHENSIVE METABOLIC PANEL
ALT: 20 U/L (ref 0–44)
AST: 21 U/L (ref 15–41)
Albumin: 3.7 g/dL (ref 3.5–5.0)
Alkaline Phosphatase: 50 U/L (ref 38–126)
Anion gap: 10 (ref 5–15)
BUN: 9 mg/dL (ref 6–20)
CO2: 22 mmol/L (ref 22–32)
Calcium: 9.2 mg/dL (ref 8.9–10.3)
Chloride: 101 mmol/L (ref 98–111)
Creatinine, Ser: 0.79 mg/dL (ref 0.61–1.24)
GFR calc Af Amer: >60 mL/min (ref >60)
GFR calc non Af Amer: >60 mL/min (ref >60)
Glucose, Bld: 94 mg/dL (ref 70–99)
Potassium: 3.2 mmol/L — ABNORMAL LOW (ref 3.5–5.1)
Sodium: 133 mmol/L — ABNORMAL LOW (ref 135–145)
Total Bilirubin: 1.4 mg/dL — ABNORMAL HIGH (ref 0.3–1.2)
Total Protein: 7.2 g/dL (ref 6.5–8.1)

## 2019-03-14 LAB — CBC WITH DIFFERENTIAL/PLATELET
Abs Immature Granulocytes: 0.19 10*3/uL — ABNORMAL HIGH (ref 0.00–0.07)
Basophils Absolute: 0.1 10*3/uL (ref 0.0–0.1)
Basophils Relative: 0 %
Eosinophils Absolute: 0 10*3/uL (ref 0.0–0.5)
Eosinophils Relative: 0 %
HCT: 39.3 % (ref 39.0–52.0)
Hemoglobin: 13.6 g/dL (ref 13.0–17.0)
Immature Granulocytes: 1 %
Lymphocytes Relative: 6 %
Lymphs Abs: 1.6 10*3/uL (ref 0.7–4.0)
MCH: 27.1 pg (ref 26.0–34.0)
MCHC: 34.6 g/dL (ref 30.0–36.0)
MCV: 78.4 fL — ABNORMAL LOW (ref 80.0–100.0)
Monocytes Absolute: 1.9 10*3/uL — ABNORMAL HIGH (ref 0.1–1.0)
Monocytes Relative: 8 %
Neutro Abs: 21 10*3/uL — ABNORMAL HIGH (ref 1.7–7.7)
Neutrophils Relative %: 85 %
Platelets: 285 10*3/uL (ref 150–400)
RBC: 5.01 MIL/uL (ref 4.22–5.81)
RDW: 14.5 % (ref 11.5–15.5)
WBC: 24.7 10*3/uL — ABNORMAL HIGH (ref 4.0–10.5)
nRBC: 0 % (ref 0.0–0.2)

## 2019-03-14 LAB — URINALYSIS, ROUTINE W REFLEX MICROSCOPIC
Bilirubin Urine: NEGATIVE
Glucose, UA: NEGATIVE mg/dL
Ketones, ur: NEGATIVE mg/dL
Nitrite: NEGATIVE
Protein, ur: NEGATIVE mg/dL
Specific Gravity, Urine: 1.013 (ref 1.005–1.030)
WBC, UA: >50 WBC/hpf — ABNORMAL HIGH (ref 0–5)
pH: 7 (ref 5.0–8.0)

## 2019-03-14 LAB — LACTIC ACID, PLASMA
Lactic Acid, Venous: 0.9 mmol/L (ref 0.5–1.9)
Lactic Acid, Venous: 0.8 mmol/L (ref 0.5–1.9)

## 2019-03-14 LAB — SARS CORONAVIRUS 2 BY RT PCR (HOSPITAL ORDER, PERFORMED IN ~~LOC~~ HOSPITAL LAB): SARS Coronavirus 2: NEGATIVE

## 2019-03-14 LAB — LIPASE, BLOOD: Lipase: 21 U/L (ref 11–51)

## 2019-03-14 MED ORDER — ACETAMINOPHEN 650 MG RE SUPP: 650.0000 mg | Freq: Four times a day (QID) | RECTAL | Status: DC | PRN

## 2019-03-14 MED ORDER — MORPHINE SULFATE (PF) 4 MG/ML IV SOLN
4.0000 mg | Freq: Once | INTRAVENOUS | Status: AC
  Administered 2019-03-14: 4 mg via INTRAVENOUS
  Filled 2019-03-14: qty 1

## 2019-03-14 MED ORDER — SODIUM CHLORIDE 0.9 % IV BOLUS (SEPSIS)
1000.0000 mL | Freq: Once | INTRAVENOUS | Status: AC
  Administered 2019-03-14: 1000 mL via INTRAVENOUS

## 2019-03-14 MED ORDER — ONDANSETRON HCL 4 MG/2ML IJ SOLN: 4.0000 mg | Freq: Four times a day (QID) | INTRAMUSCULAR | Status: DC | PRN

## 2019-03-14 MED ORDER — SODIUM CHLORIDE 0.9 % IV SOLN
2.0000 g | Freq: Three times a day (TID) | INTRAVENOUS | Status: DC
  Administered 2019-03-15 – 2019-03-16 (×5): 2 g via INTRAVENOUS
  Filled 2019-03-14 (×5): qty 2

## 2019-03-14 MED ORDER — SODIUM CHLORIDE 0.9 % IV SOLN
2.0000 g | Freq: Once | INTRAVENOUS | Status: AC
  Administered 2019-03-14: 2 g via INTRAVENOUS
  Filled 2019-03-14: qty 2

## 2019-03-14 MED ORDER — ONDANSETRON HCL 4 MG PO TABS: 4.0000 mg | ORAL_TABLET | Freq: Four times a day (QID) | ORAL | Status: DC | PRN

## 2019-03-14 MED ORDER — IBUPROFEN 600 MG PO TABS
600.0000 mg | ORAL_TABLET | Freq: Three times a day (TID) | ORAL | Status: DC
  Administered 2019-03-14 – 2019-03-16 (×5): 600 mg via ORAL
  Filled 2019-03-14 (×5): qty 1

## 2019-03-14 MED ORDER — VANCOMYCIN HCL IN DEXTROSE 1-5 GM/200ML-% IV SOLN
1000.0000 mg | Freq: Once | INTRAVENOUS | Status: AC
  Administered 2019-03-14: 1000 mg via INTRAVENOUS
  Filled 2019-03-14: qty 200

## 2019-03-14 MED ORDER — SODIUM CHLORIDE 0.9% FLUSH: 3.0000 mL | INTRAVENOUS | Status: DC | PRN

## 2019-03-14 MED ORDER — SODIUM CHLORIDE 0.9% FLUSH
3.0000 mL | Freq: Two times a day (BID) | INTRAVENOUS | Status: DC
  Administered 2019-03-14 – 2019-03-15 (×2): 3 mL via INTRAVENOUS

## 2019-03-14 MED ORDER — OXYBUTYNIN CHLORIDE 5 MG PO TABS
5.0000 mg | ORAL_TABLET | Freq: Every day | ORAL | Status: DC
  Administered 2019-03-14 – 2019-03-15 (×2): 5 mg via ORAL
  Filled 2019-03-14 (×2): qty 1

## 2019-03-14 MED ORDER — VANCOMYCIN HCL 10 G IV SOLR
1250.0000 mg | Freq: Two times a day (BID) | INTRAVENOUS | Status: DC
  Administered 2019-03-15 – 2019-03-16 (×3): 1250 mg via INTRAVENOUS
  Filled 2019-03-14 (×4): qty 1250

## 2019-03-14 MED ORDER — MORPHINE SULFATE (PF) 2 MG/ML IV SOLN: 2.0000 mg | INTRAVENOUS | Status: DC | PRN

## 2019-03-14 MED ORDER — ACETAMINOPHEN 325 MG PO TABS: 650.0000 mg | ORAL_TABLET | Freq: Four times a day (QID) | ORAL | Status: DC | PRN

## 2019-03-14 MED ORDER — SODIUM CHLORIDE 0.9 % IV SOLN
INTRAVENOUS | Status: DC
  Administered 2019-03-14 – 2019-03-16 (×4): via INTRAVENOUS

## 2019-03-14 MED ORDER — HYDROCODONE-ACETAMINOPHEN 10-325 MG PO TABS
1.0000 | ORAL_TABLET | Freq: Four times a day (QID) | ORAL | Status: DC | PRN
  Administered 2019-03-14: 1 via ORAL
  Filled 2019-03-14: qty 1

## 2019-03-14 MED ORDER — METRONIDAZOLE IN NACL 5-0.79 MG/ML-% IV SOLN
500.0000 mg | Freq: Once | INTRAVENOUS | Status: AC
  Administered 2019-03-14: 500 mg via INTRAVENOUS
  Filled 2019-03-14: qty 100

## 2019-03-14 MED ORDER — ACETAMINOPHEN 500 MG PO TABS: 500.0000 mg | ORAL_TABLET | Freq: Four times a day (QID) | ORAL | Status: DC | PRN

## 2019-03-14 MED ORDER — ONDANSETRON HCL 4 MG/2ML IJ SOLN
4.0000 mg | Freq: Once | INTRAMUSCULAR | Status: AC
  Administered 2019-03-14: 15:00:00 4 mg via INTRAVENOUS
  Filled 2019-03-14: qty 2

## 2019-03-14 MED ORDER — SODIUM CHLORIDE 0.9 % IV SOLN: 250.0000 mL | INTRAVENOUS | Status: DC | PRN

## 2019-03-14 MED ORDER — SODIUM CHLORIDE 0.9 % IV BOLUS (SEPSIS)
500.0000 mL | Freq: Once | INTRAVENOUS | Status: AC
  Administered 2019-03-14: 500 mL via INTRAVENOUS

## 2019-03-14 MED ORDER — ENOXAPARIN SODIUM 40 MG/0.4ML ~~LOC~~ SOLN
40.0000 mg | SUBCUTANEOUS | Status: DC
  Administered 2019-03-14 – 2019-03-15 (×2): 40 mg via SUBCUTANEOUS
  Filled 2019-03-14 (×2): qty 0.4

## 2019-03-14 MED ORDER — ACETAMINOPHEN 500 MG PO TABS
1000.0000 mg | ORAL_TABLET | Freq: Once | ORAL | Status: AC
  Administered 2019-03-14: 1000 mg via ORAL
  Filled 2019-03-14: qty 2
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
## 2019-03-14 NOTE — ED Triage Notes (Signed)
Pt in with c/o HA, emesis since yesterday. Also has 100F temp in triage. Denies any abd pain presently

## 2019-03-14 NOTE — Progress Notes (Signed)
Pt arrived to 5 [address] 37. Alert and oriented x 4, pt identified appropriately. VS stable, denied chest pain and SOB. No signs of distress. Pt paraplegic, wheelchair bound at the baseline.  Pt oriented to room and equipment instructed to call for assistance. Pt instructed on how to use call bell and call bell left with in reach.  Will continue to monitor and treat pt per MD orders.

## 2019-03-14 NOTE — ED Notes (Signed)
Patient transported to Ultrasound 

## 2019-03-14 NOTE — Progress Notes (Signed)
Pharmacy Antibiotic Note  Roy Rodriguez is a 28 y.o. male admitted on 03/14/2019 with headache, fever, and emesis in the setting of R-testicle swelling and concern for sepsis.  Pharmacy has been consulted for Vancomyin + Cefepime dosing.  The patient has received a dose of Vancomycin 1g + Cefepime 2g + Flagyl 500 mg already today. SCr 0.79 - noted paraplegia with old records indicating possible BL of 0.6-0.7.   Noted allergy or hives with Amoxicillin but per chart review has tolerated cephalosporins previously.  Plan: - Start Vancomycin 1g/12h - Start Cefepime 2g IV every 12 hours - Will continue to follow renal function, culture results, LOT, and antibiotic de-escalation plans      Temp (24hrs), Avg:100 F (37.8 C), Min:100 F (37.8 C), Max:100 F (37.8 C)  No results for input(s): WBC, CREATININE, LATICACIDVEN, VANCOTROUGH, VANCOPEAK, VANCORANDOM, GENTTROUGH, GENTPEAK, GENTRANDOM, TOBRATROUGH, TOBRAPEAK, TOBRARND, AMIKACINPEAK, AMIKACINTROU, AMIKACIN in the last 168 hours.  CrCl cannot be calculated (Patient's most recent lab result is older than the maximum 21 days allowed.).    Allergies  Allergen Reactions  . Amoxicillin Hives    Antimicrobials this admission: Vanc 5/23 >> Cefepime 5/23 >> Flagyl 5/23 >>  Microbiology results: 5/23 COVID >> 5/23 BCx >> 5/23 UCx >>  Thank you for allowing pharmacy to be a part of this patient's care.  Georgina Pillion, PharmD, BCPS Clinical Pharmacist Clinical phone for 03/14/2019: [phone] 03/14/2019 1:55 PM   **Pharmacist phone directory can now be found on [email or web address] ([email or web address]).  Listed under [facility].
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
## 2019-03-15 ENCOUNTER — Encounter (HOSPITAL_COMMUNITY): Payer: Self-pay

## 2019-03-15 DIAGNOSIS — G8220 Paraplegia, unspecified: Secondary | ICD-10-CM

## 2019-03-15 DIAGNOSIS — A419 Sepsis, unspecified organism: Principal | ICD-10-CM

## 2019-03-15 DIAGNOSIS — D72829 Elevated white blood cell count, unspecified: Secondary | ICD-10-CM

## 2019-03-15 DIAGNOSIS — N451 Epididymitis: Secondary | ICD-10-CM

## 2019-03-15 LAB — CBC WITH DIFFERENTIAL/PLATELET
Abs Immature Granulocytes: 0.13 10*3/uL — ABNORMAL HIGH (ref 0.00–0.07)
Basophils Absolute: 0.1 10*3/uL (ref 0.0–0.1)
Basophils Relative: 0 %
Eosinophils Absolute: 0.1 10*3/uL (ref 0.0–0.5)
Eosinophils Relative: 0 %
HCT: 37.6 % — ABNORMAL LOW (ref 39.0–52.0)
Hemoglobin: 12.6 g/dL — ABNORMAL LOW (ref 13.0–17.0)
Immature Granulocytes: 1 %
Lymphocytes Relative: 4 %
Lymphs Abs: 0.8 10*3/uL (ref 0.7–4.0)
MCH: 27 pg (ref 26.0–34.0)
MCHC: 33.5 g/dL (ref 30.0–36.0)
MCV: 80.7 fL (ref 80.0–100.0)
Monocytes Absolute: 0.8 10*3/uL (ref 0.1–1.0)
Monocytes Relative: 4 %
Neutro Abs: 19.1 10*3/uL — ABNORMAL HIGH (ref 1.7–7.7)
Neutrophils Relative %: 91 %
Platelets: 269 10*3/uL (ref 150–400)
RBC: 4.66 MIL/uL (ref 4.22–5.81)
RDW: 14.7 % (ref 11.5–15.5)
WBC: 20.9 10*3/uL — ABNORMAL HIGH (ref 4.0–10.5)
nRBC: 0 % (ref 0.0–0.2)

## 2019-03-15 LAB — BASIC METABOLIC PANEL
Anion gap: 7 (ref 5–15)
BUN: 11 mg/dL (ref 6–20)
CO2: 25 mmol/L (ref 22–32)
Calcium: 8.6 mg/dL — ABNORMAL LOW (ref 8.9–10.3)
Chloride: 104 mmol/L (ref 98–111)
Creatinine, Ser: 0.78 mg/dL (ref 0.61–1.24)
GFR calc Af Amer: >60 mL/min (ref >60)
GFR calc non Af Amer: >60 mL/min (ref >60)
Glucose, Bld: 135 mg/dL — ABNORMAL HIGH (ref 70–99)
Potassium: 3 mmol/L — ABNORMAL LOW (ref 3.5–5.1)
Sodium: 136 mmol/L (ref 135–145)

## 2019-03-15 LAB — RPR: RPR Ser Ql: NONREACTIVE

## 2019-03-15 LAB — HIV ANTIBODY (ROUTINE TESTING W REFLEX): HIV Screen 4th Generation wRfx: NONREACTIVE

## 2019-03-15 NOTE — Progress Notes (Signed)
Pt self catheterize at home, requested to perform self catheterization at the bedside. [place], NP notified and RN instructed that is ok for pt to self catheterize. Equipment provided to pt and education completed. Pt family will bring pt's own supply in today.

## 2019-03-15 NOTE — Plan of Care (Signed)
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
## 2019-03-16 LAB — BASIC METABOLIC PANEL
Anion gap: 6 (ref 5–15)
BUN: 7 mg/dL (ref 6–20)
CO2: 27 mmol/L (ref 22–32)
Calcium: 8.5 mg/dL — ABNORMAL LOW (ref 8.9–10.3)
Chloride: 106 mmol/L (ref 98–111)
Creatinine, Ser: 0.69 mg/dL (ref 0.61–1.24)
GFR calc Af Amer: >60 mL/min (ref >60)
GFR calc non Af Amer: >60 mL/min (ref >60)
Glucose, Bld: 93 mg/dL (ref 70–99)
Potassium: 3.5 mmol/L (ref 3.5–5.1)
Sodium: 139 mmol/L (ref 135–145)

## 2019-03-16 LAB — CBC WITH DIFFERENTIAL/PLATELET
Abs Immature Granulocytes: 0.08 10*3/uL — ABNORMAL HIGH (ref 0.00–0.07)
Basophils Absolute: 0 10*3/uL (ref 0.0–0.1)
Basophils Relative: 0 %
Eosinophils Absolute: 0.2 10*3/uL (ref 0.0–0.5)
Eosinophils Relative: 1 %
HCT: 33.4 % — ABNORMAL LOW (ref 39.0–52.0)
Hemoglobin: 11.4 g/dL — ABNORMAL LOW (ref 13.0–17.0)
Immature Granulocytes: 1 %
Lymphocytes Relative: 12 %
Lymphs Abs: 1.9 10*3/uL (ref 0.7–4.0)
MCH: 27.3 pg (ref 26.0–34.0)
MCHC: 34.1 g/dL (ref 30.0–36.0)
MCV: 80.1 fL (ref 80.0–100.0)
Monocytes Absolute: 1.1 10*3/uL — ABNORMAL HIGH (ref 0.1–1.0)
Monocytes Relative: 7 %
Neutro Abs: 12.2 10*3/uL — ABNORMAL HIGH (ref 1.7–7.7)
Neutrophils Relative %: 79 %
Platelets: 258 10*3/uL (ref 150–400)
RBC: 4.17 MIL/uL — ABNORMAL LOW (ref 4.22–5.81)
RDW: 14.6 % (ref 11.5–15.5)
WBC: 15.6 10*3/uL — ABNORMAL HIGH (ref 4.0–10.5)
nRBC: 0 % (ref 0.0–0.2)

## 2019-03-16 LAB — URINE CULTURE: Culture: >=100000 — AB

## 2019-03-16 LAB — MAGNESIUM: Magnesium: 2 mg/dL (ref 1.7–2.4)

## 2019-03-16 MED ORDER — SODIUM CHLORIDE 0.9 % IV SOLN: 1.0000 g | INTRAVENOUS | Status: DC
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
## 2019-03-16 NOTE — Progress Notes (Signed)
Pt was educated on antibiotic treatment and compliance. Pt was anxious to leave and requested meds be switched to orals. Then pt requested meds be sent to primary physician or pharmacy and he'll have it picked up. MD paged about medication request and again for [facility] request. Pt did not want to wait for MD to call back. PIV was removed and AMA form signed.
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
## 2019-03-17 LAB — GC/CHLAMYDIA PROBE AMP (~~LOC~~) NOT AT ARMC
Chlamydia: NEGATIVE
Neisseria Gonorrhea: NEGATIVE

## 2019-03-19 LAB — CULTURE, BLOOD (ROUTINE X 2)
Culture: NO GROWTH
Culture: NO GROWTH
Special Requests: ADEQUATE
Special Requests: ADEQUATE

## 2020-02-11 IMAGING — US ULTRASOUND SCROTUM DOPPLER COMPLETE
1 series · 13 of 25 positions shown · non-contrast
Comparison: None.

CLINICAL DATA: Right scrotal region pain and swelling

EXAM:
SCROTAL ULTRASOUND
DOPPLER ULTRASOUND OF THE TESTICLES
TECHNIQUE: Complete ultrasound examination of the testicles, epididymis, and
other scrotal structures was performed. Color and spectral Doppler
ultrasound were also utilized to evaluate blood flow to the
testicles.

[Series 1: ultrasound scrotum doppler complete · 37 acquisitions, 13 frames shown]
[im 1/37]
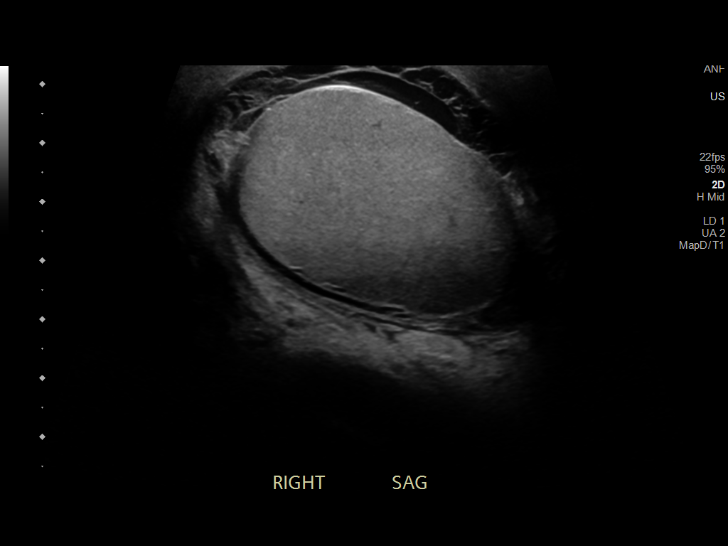
[im 4/37]
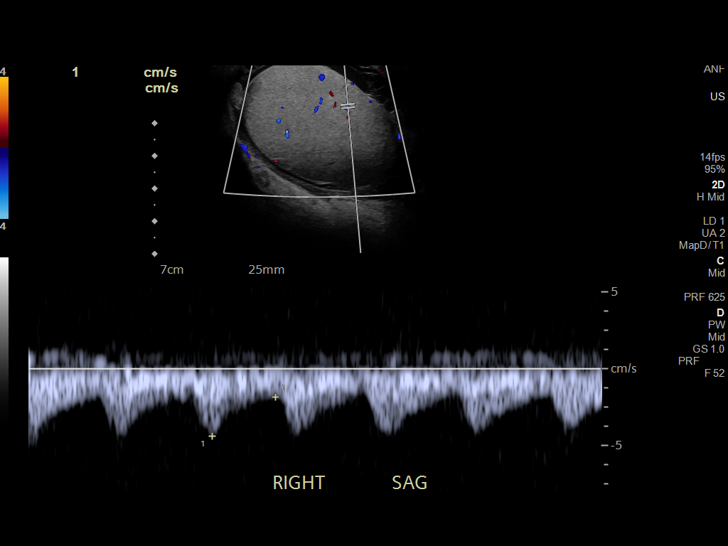
[im 7/37]
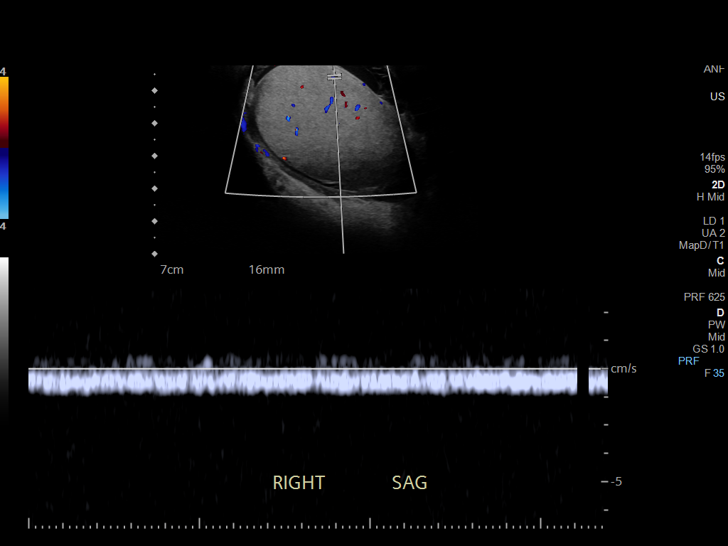
[im 10/37]
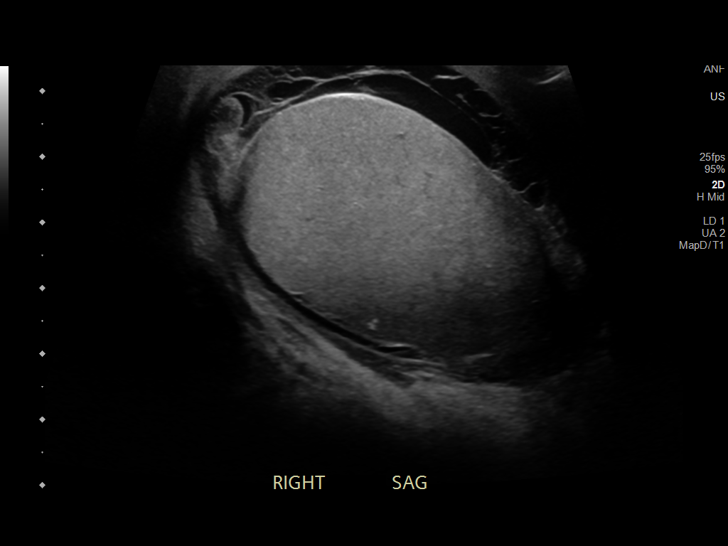
[im 13/37]
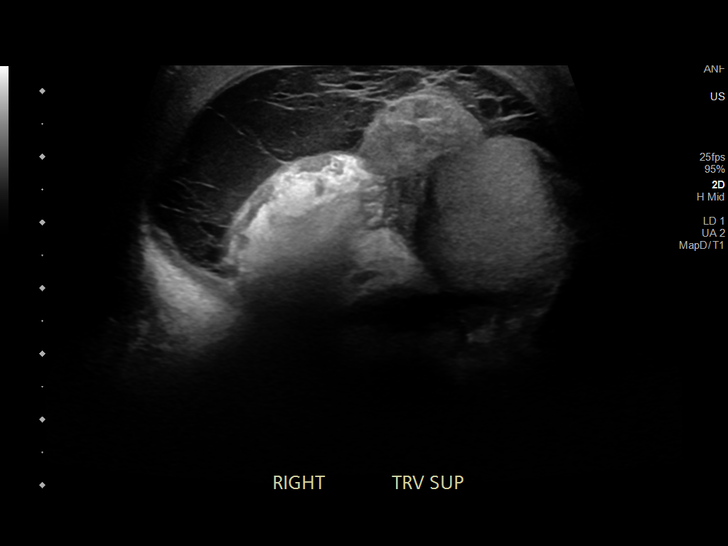
[im 16/37]
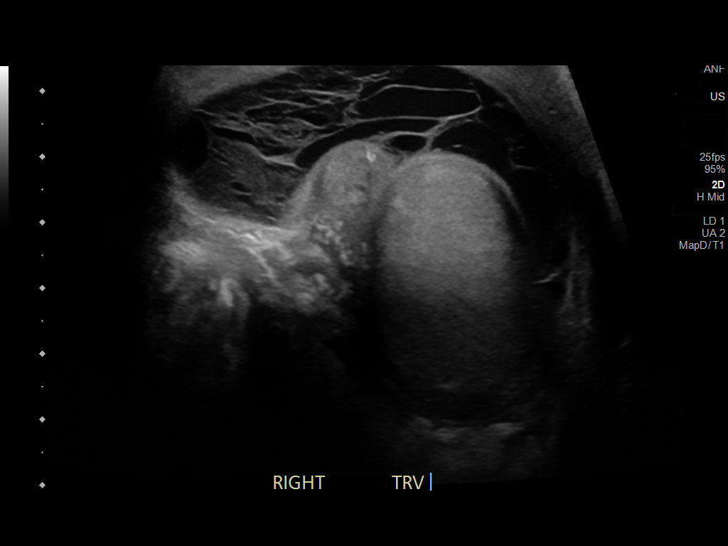
[im 19/37]
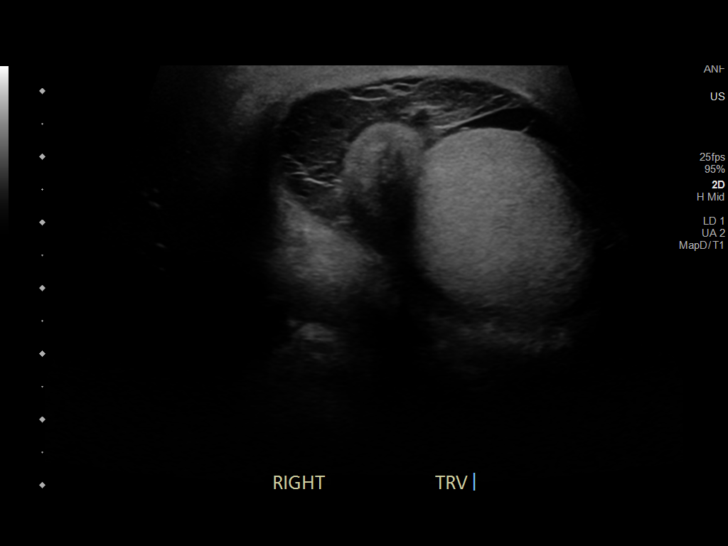
[im 22/37]
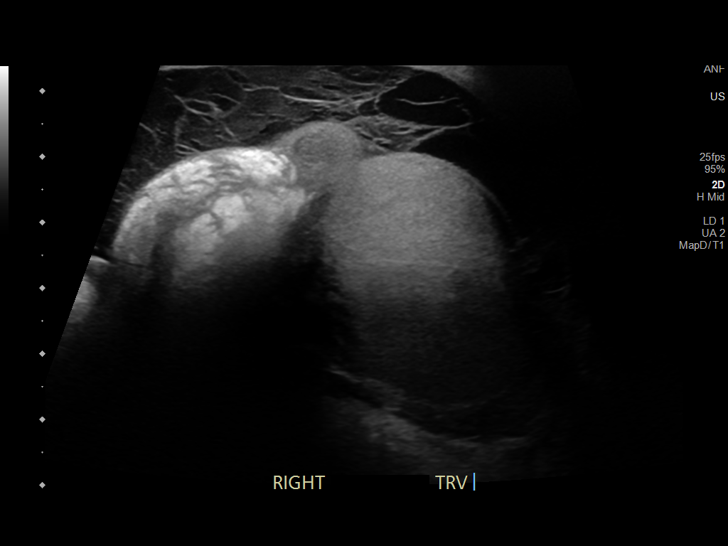
[im 25/37]
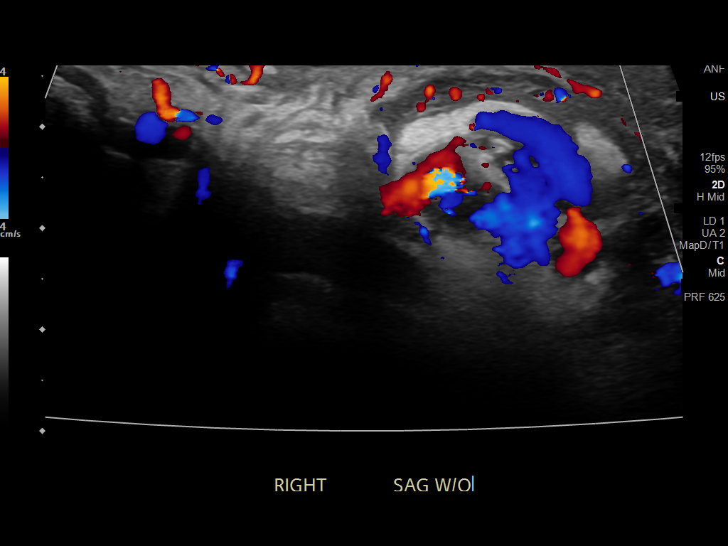
[im 28/37]
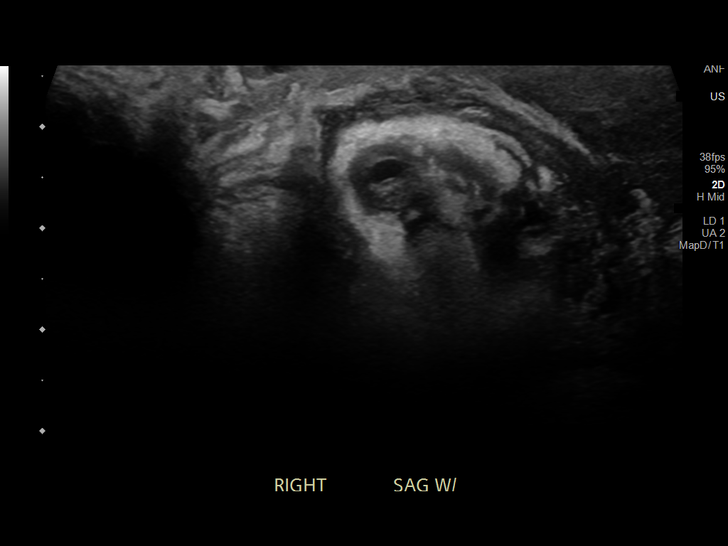
[im 31/37]
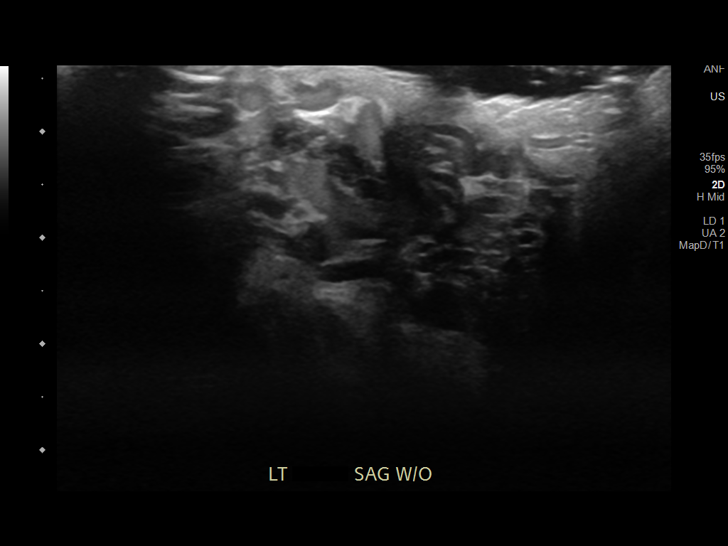
[im 34/37]
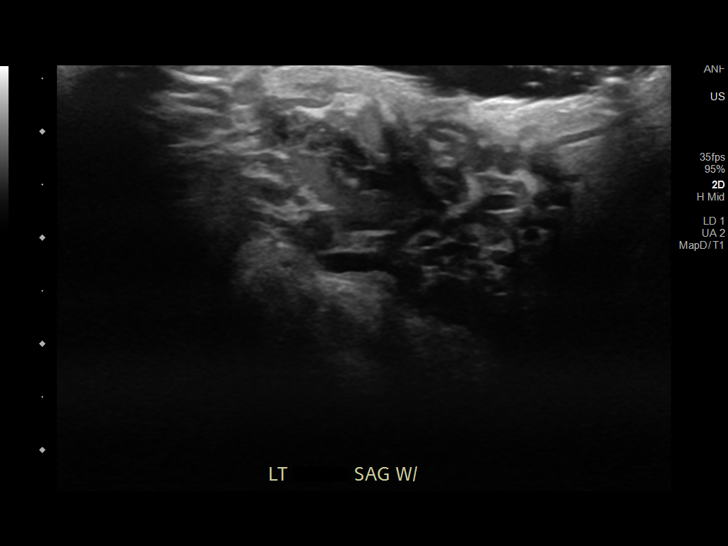
[im 37/37]
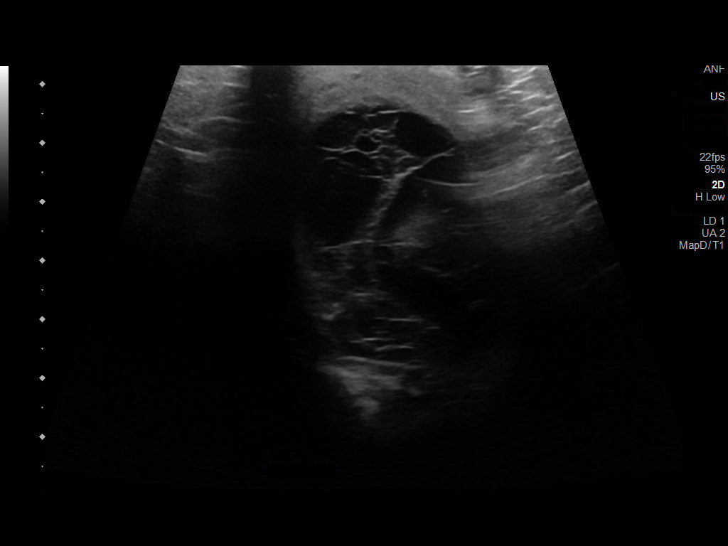

[13 of 25 positions shown; findings below may reference images not displayed]

FINDINGS: Right testicle

Measurements: 5.2 x 3.5 x 3.5 cm. No mass or microlithiasis
visualized.

Left testicle

No left testis evident.

Right epididymis: The right epididymis is diffusely edematous and
hypervascular consistent with diffuse epididymitis. No well-defined
epididymal mass is evident.

Left epididymis:  No left epididymis appreciable.

Hydrocele: There is a complex hydrocele with multiple septations and
areas of ill-defined increased echogenicity throughout the right
hydrocele. No hydrocele on the left.

Varicocele: Prominent venous structures are noted throughout the
scrotum, presumed varicoceles.

Pulsed Doppler interrogation of the right testis demonstrates normal
low resistance arterial and venous waveforms. No demonstrable left
testis.
IMPRESSION: 1. Only a single testis and epididymis noted, felt to arise from the
right side. The right testis appears normal in size and contour
without evident mass. No orchitis or testicular torsion noted on the
right.

2. Extensive right-sided epididymitis with the right epididymis
diffusely enlarged and hyperemic. Complex hydrocele on the right
with septations and ill-defined areas of echogenicity, likely
secondary to infection. No well-defined abscess evident.

3. Prominent venous structures in the visualized scrotum, likely
varicoceles.

## 2020-05-12 ENCOUNTER — Ambulatory Visit: Payer: Medicaid Other | Admitting: Physical Therapy

## 2020-05-13 ENCOUNTER — Other Ambulatory Visit: Payer: Self-pay

## 2020-05-13 ENCOUNTER — Emergency Department (HOSPITAL_COMMUNITY)
Admission: EM | Admit: 2020-05-13 | Discharge: 2020-05-13 | Disposition: A | Discharge status: Home / Self Care | Payer: Medicaid Other | Attending: Emergency Medicine | Admitting: Emergency Medicine

## 2020-05-13 ENCOUNTER — Encounter (HOSPITAL_COMMUNITY): Payer: Self-pay | Admitting: Pharmacy Technician

## 2020-05-13 ENCOUNTER — Emergency Department (HOSPITAL_COMMUNITY): Payer: Medicaid Other

## 2020-05-13 DIAGNOSIS — R2241 Localized swelling, mass and lump, right lower limb: Secondary | ICD-10-CM

## 2020-05-13 DIAGNOSIS — L03116 Cellulitis of left lower limb: Secondary | ICD-10-CM

## 2020-05-13 DIAGNOSIS — F17210 Nicotine dependence, cigarettes, uncomplicated: Secondary | ICD-10-CM | POA: Diagnosis not present

## 2020-05-13 DIAGNOSIS — M79672 Pain in left foot: Secondary | ICD-10-CM | POA: Diagnosis present

## 2020-05-13 MED ORDER — DOXYCYCLINE HYCLATE 100 MG PO CAPS: 100.0000 mg | ORAL_CAPSULE | Freq: Two times a day (BID) | ORAL | 0 refills | Status: DC

## 2020-05-13 MED ORDER — DOXYCYCLINE HYCLATE 100 MG PO TABS
100.0000 mg | ORAL_TABLET | Freq: Once | ORAL | Status: AC
  Administered 2020-05-13: 100 mg via ORAL
  Filled 2020-05-13: qty 1

## 2020-05-13 NOTE — ED Triage Notes (Signed)
Pt arrives pov with reports of swelling to toes on R foot onset 2-3 days ago. No known injury.

## 2020-05-13 NOTE — Discharge Instructions (Signed)
Your x-ray today shows swelling but no evidence of deep infection of the bone.  Because of the wound on your toe and the discoloration could all be from bruising and injury however will cover with antibiotics because the swelling and mild redness of your foot.  If you develop fever, feeling ill or the redness starts becoming significantly worse or your foot starts draining you need to return to the emergency room.  Antibiotic was sent to your pharmacy.
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
## 2020-06-09 ENCOUNTER — Encounter: Payer: Self-pay | Admitting: Physical Therapy

## 2020-06-09 ENCOUNTER — Ambulatory Visit: Payer: Medicaid Other | Attending: Internal Medicine | Admitting: Physical Therapy

## 2020-06-09 ENCOUNTER — Other Ambulatory Visit: Payer: Self-pay

## 2020-06-09 DIAGNOSIS — G8929 Other chronic pain: Secondary | ICD-10-CM | POA: Diagnosis present

## 2020-06-09 DIAGNOSIS — R209 Unspecified disturbances of skin sensation: Secondary | ICD-10-CM | POA: Diagnosis present

## 2020-06-09 DIAGNOSIS — M545 Low back pain: Secondary | ICD-10-CM | POA: Diagnosis present

## 2020-06-09 DIAGNOSIS — M25511 Pain in right shoulder: Secondary | ICD-10-CM | POA: Diagnosis present

## 2020-06-09 DIAGNOSIS — G8221 Paraplegia, complete: Secondary | ICD-10-CM | POA: Insufficient documentation

## 2020-06-09 DIAGNOSIS — R208 Other disturbances of skin sensation: Secondary | ICD-10-CM

## 2020-06-09 DIAGNOSIS — R29818 Other symptoms and signs involving the nervous system: Secondary | ICD-10-CM | POA: Diagnosis present
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
## 2020-11-15 ENCOUNTER — Other Ambulatory Visit: Payer: Self-pay | Admitting: Internal Medicine

## 2020-11-17 LAB — URINE CULTURE
MICRO NUMBER:: 11454752
SPECIMEN QUALITY:: ADEQUATE

## 2020-11-24 ENCOUNTER — Other Ambulatory Visit: Payer: Self-pay | Admitting: Internal Medicine

## 2020-11-25 LAB — COMPLETE METABOLIC PANEL WITH GFR
AG Ratio: 1.8 (calc) (ref 1.0–2.5)
ALT: 27 U/L (ref 9–46)
AST: 22 U/L (ref 10–40)
Albumin: 4.5 g/dL (ref 3.6–5.1)
Alkaline phosphatase (APISO): 52 U/L (ref 36–130)
BUN: 12 mg/dL (ref 7–25)
CO2: 22 mmol/L (ref 20–32)
Calcium: 9.8 mg/dL (ref 8.6–10.3)
Chloride: 102 mmol/L (ref 98–110)
Creat: 0.81 mg/dL (ref 0.60–1.35)
GFR, Est African American: 139 mL/min/{1.73_m2} (ref >=60)
GFR, Est Non African American: 120 mL/min/{1.73_m2} (ref >=60)
Globulin: 2.5 g/dL (calc) (ref 1.9–3.7)
Glucose, Bld: 80 mg/dL (ref 65–99)
Potassium: 4.4 mmol/L (ref 3.5–5.3)
Sodium: 138 mmol/L (ref 135–146)
Total Bilirubin: 0.3 mg/dL (ref 0.2–1.2)
Total Protein: 7 g/dL (ref 6.1–8.1)

## 2020-11-25 LAB — LIPID PANEL
Cholesterol: 184 mg/dL (ref <200)
HDL: 49 mg/dL (ref >=40)
LDL Cholesterol (Calc): 88 mg/dL (calc)
Non-HDL Cholesterol (Calc): 135 mg/dL (calc) — ABNORMAL HIGH (ref <130)
Total CHOL/HDL Ratio: 3.8 (calc) (ref <5.0)
Triglycerides: 356 mg/dL — ABNORMAL HIGH (ref <150)

## 2020-11-25 LAB — CBC
HCT: 42 % (ref 38.5–50.0)
Hemoglobin: 14.4 g/dL (ref 13.2–17.1)
MCH: 27.1 pg (ref 27.0–33.0)
MCHC: 34.3 g/dL (ref 32.0–36.0)
MCV: 79.1 fL — ABNORMAL LOW (ref 80.0–100.0)
MPV: 10.2 fL (ref 7.5–12.5)
Platelets: 292 10*3/uL (ref 140–400)
RBC: 5.31 10*6/uL (ref 4.20–5.80)
RDW: 13.5 % (ref 11.0–15.0)
WBC: 9.4 10*3/uL (ref 3.8–10.8)

## 2020-11-25 LAB — TSH: TSH: 0.36 mIU/L — ABNORMAL LOW (ref 0.40–4.50)

## 2020-11-25 LAB — VITAMIN D 25 HYDROXY (VIT D DEFICIENCY, FRACTURES): Vit D, 25-Hydroxy: 19 ng/mL — ABNORMAL LOW (ref 30–100)

## 2020-11-25 LAB — HIV ANTIBODY (ROUTINE TESTING W REFLEX): HIV 1&2 Ab, 4th Generation: NONREACTIVE

## 2020-11-25 LAB — RPR (MONITOR) W/REFL: RPR (Monitor) w/refl Titer: NONREACTIVE

## 2021-04-16 ENCOUNTER — Encounter (HOSPITAL_COMMUNITY): Payer: Self-pay

## 2021-04-16 ENCOUNTER — Ambulatory Visit (HOSPITAL_COMMUNITY)
Admission: EM | Admit: 2021-04-16 | Discharge: 2021-04-16 | Disposition: A | Discharge status: Home / Self Care | Payer: Medicaid Other

## 2021-04-16 ENCOUNTER — Other Ambulatory Visit: Payer: Self-pay

## 2021-04-16 DIAGNOSIS — L03011 Cellulitis of right finger: Secondary | ICD-10-CM | POA: Diagnosis not present

## 2021-04-16 DIAGNOSIS — M79644 Pain in right finger(s): Secondary | ICD-10-CM

## 2021-04-16 MED ORDER — DOXYCYCLINE HYCLATE 100 MG PO CAPS: 100.0000 mg | ORAL_CAPSULE | Freq: Two times a day (BID) | ORAL | 0 refills | Status: DC

## 2021-04-16 MED ORDER — NAPROXEN 500 MG PO TABS: 500.0000 mg | ORAL_TABLET | Freq: Two times a day (BID) | ORAL | 0 refills | Status: DC

## 2021-04-16 NOTE — Discharge Instructions (Addendum)
He had refused incision and drainage for paronychia today.  Please start doxycycline to address the infection.  Use naproxen for pain and inflammation.  Make sure you soak your finger in warm soapy water and apply pressure to the infection to get it to drain.  If you worsen over the next 48 hours please return to our clinic for an incision and drainage.

## 2021-04-16 NOTE — ED Triage Notes (Signed)
Pt reports swelling and numbness in the right ring finger x 2 days. States he cut his nail to short.
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
## 2021-04-19 ENCOUNTER — Encounter (HOSPITAL_COMMUNITY): Payer: Self-pay

## 2021-04-19 ENCOUNTER — Ambulatory Visit (HOSPITAL_COMMUNITY)
Admission: EM | Admit: 2021-04-19 | Discharge: 2021-04-19 | Disposition: A | Discharge status: Home / Self Care | Payer: Medicaid Other

## 2021-04-19 ENCOUNTER — Other Ambulatory Visit: Payer: Self-pay

## 2021-04-19 DIAGNOSIS — M79644 Pain in right finger(s): Secondary | ICD-10-CM | POA: Diagnosis not present

## 2021-04-19 DIAGNOSIS — L03011 Cellulitis of right finger: Secondary | ICD-10-CM

## 2021-04-19 MED ORDER — LIDOCAINE HCL 2 % IJ SOLN
INTRAMUSCULAR | Status: AC
  Filled 2021-04-19: qty 20

## 2021-04-19 NOTE — ED Triage Notes (Signed)
Pt presents for progression in right ring finger swelling & pain X 4 days that is unrelieved with prescribed antibiotics.

## 2021-04-19 NOTE — Discharge Instructions (Addendum)
Please change your dressing 3-5 times daily. Do not apply any ointments or creams. Each time you change your dressing, make sure that you are pressing on the wound to get pus to come out.  Try your best to have a family member help you clean gently around the perimeter of the wound with gentle soap and warm water. Pat your wound dry and let it air out if possible to make sure it is dry before reapplying another dressing.   
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
## 2021-05-10 ENCOUNTER — Encounter (HOSPITAL_COMMUNITY): Payer: Self-pay | Admitting: *Deleted

## 2021-05-10 ENCOUNTER — Other Ambulatory Visit: Payer: Self-pay

## 2021-05-10 ENCOUNTER — Ambulatory Visit (HOSPITAL_COMMUNITY)
Admission: EM | Admit: 2021-05-10 | Discharge: 2021-05-10 | Disposition: A | Discharge status: Home / Self Care | Payer: Medicaid Other | Attending: Family | Admitting: Family

## 2021-05-10 DIAGNOSIS — B356 Tinea cruris: Secondary | ICD-10-CM | POA: Insufficient documentation

## 2021-05-10 DIAGNOSIS — R8290 Unspecified abnormal findings in urine: Secondary | ICD-10-CM | POA: Diagnosis not present

## 2021-05-10 DIAGNOSIS — R21 Rash and other nonspecific skin eruption: Secondary | ICD-10-CM | POA: Diagnosis present

## 2021-05-10 DIAGNOSIS — R350 Frequency of micturition: Secondary | ICD-10-CM | POA: Diagnosis not present

## 2021-05-10 LAB — POCT URINALYSIS DIPSTICK, ED / UC
Bilirubin Urine: NEGATIVE
Glucose, UA: NEGATIVE mg/dL
Ketones, ur: NEGATIVE mg/dL
Nitrite: POSITIVE — AB
Protein, ur: NEGATIVE mg/dL
Specific Gravity, Urine: 1.025 (ref 1.005–1.030)
Urobilinogen, UA: 1 mg/dL (ref 0.0–1.0)
pH: 5.5 (ref 5.0–8.0)

## 2021-05-10 MED ORDER — CLOTRIMAZOLE 1 % EX CREA: 1.0000 "application " | TOPICAL_CREAM | Freq: Two times a day (BID) | CUTANEOUS | 0 refills | Status: AC

## 2021-05-10 NOTE — ED Triage Notes (Signed)
Pt reports rash and skin peeling on Penis.
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
## 2021-05-10 NOTE — Discharge Instructions (Addendum)
Recommend use Clotrimazole anti-fungal cream to affected area twice a day as directed. Will wait on urine culture to determine if you need to go back on any antibiotic for possible UTI. Recommend keep your appointment with your Urologist for next week. Follow-up pending urine culture results.

## 2021-05-13 LAB — URINE CULTURE: Culture: >=100000 — AB

## 2021-05-16 ENCOUNTER — Telehealth (HOSPITAL_COMMUNITY): Payer: Self-pay | Admitting: Emergency Medicine

## 2021-05-16 MED ORDER — NITROFURANTOIN MONOHYD MACRO 100 MG PO CAPS: 100.0000 mg | ORAL_CAPSULE | Freq: Two times a day (BID) | ORAL | 0 refills | Status: AC

## 2022-02-19 ENCOUNTER — Other Ambulatory Visit: Payer: Self-pay | Admitting: Internal Medicine

## 2022-02-21 LAB — CBC
HCT: 48.9 % (ref 38.5–50.0)
Hemoglobin: 16.1 g/dL (ref 13.2–17.1)
MCH: 27 pg (ref 27.0–33.0)
MCHC: 32.9 g/dL (ref 32.0–36.0)
MCV: 82 fL (ref 80.0–100.0)
MPV: 10.2 fL (ref 7.5–12.5)
Platelets: 358 10*3/uL (ref 140–400)
RBC: 5.96 10*6/uL — ABNORMAL HIGH (ref 4.20–5.80)
RDW: 13.8 % (ref 11.0–15.0)
WBC: 7.2 10*3/uL (ref 3.8–10.8)

## 2022-02-21 LAB — COMPLETE METABOLIC PANEL WITH GFR
AG Ratio: 1.6 (calc) (ref 1.0–2.5)
ALT: 22 U/L (ref 9–46)
AST: 22 U/L (ref 10–40)
Albumin: 4.7 g/dL (ref 3.6–5.1)
Alkaline phosphatase (APISO): 51 U/L (ref 36–130)
BUN: 14 mg/dL (ref 7–25)
CO2: 26 mmol/L (ref 20–32)
Calcium: 10.1 mg/dL (ref 8.6–10.3)
Chloride: 102 mmol/L (ref 98–110)
Creat: 0.81 mg/dL (ref 0.60–1.26)
Globulin: 3 g/dL (calc) (ref 1.9–3.7)
Glucose, Bld: 53 mg/dL — ABNORMAL LOW (ref 65–99)
Potassium: 5 mmol/L (ref 3.5–5.3)
Sodium: 139 mmol/L (ref 135–146)
Total Bilirubin: 0.5 mg/dL (ref 0.2–1.2)
Total Protein: 7.7 g/dL (ref 6.1–8.1)
eGFR: 122 mL/min/{1.73_m2} (ref >=60)

## 2022-02-21 LAB — LIPID PANEL
Cholesterol: 175 mg/dL (ref <200)
HDL: 63 mg/dL (ref >=40)
LDL Cholesterol (Calc): 95 mg/dL (calc)
Non-HDL Cholesterol (Calc): 112 mg/dL (calc) (ref <130)
Total CHOL/HDL Ratio: 2.8 (calc) (ref <5.0)
Triglycerides: 79 mg/dL (ref <150)

## 2022-02-21 LAB — RPR (MONITOR) W/REFL: RPR (Monitor) w/refl Titer: NONREACTIVE

## 2022-02-21 LAB — HIV-1 RNA ULTRAQUANT REFLEX TO GENTYP+

## 2022-02-21 LAB — VITAMIN D 25 HYDROXY (VIT D DEFICIENCY, FRACTURES): Vit D, 25-Hydroxy: 20 ng/mL — ABNORMAL LOW (ref 30–100)

## 2023-06-04 ENCOUNTER — Other Ambulatory Visit: Payer: Self-pay | Admitting: Internal Medicine

## 2023-06-07 LAB — URINE CULTURE: MICRO NUMBER:: 15324444
